# Patient Record
Sex: Male | Born: 1985 | Race: White | Hispanic: No | Marital: Married | Smoking: Former smoker
Health system: Southern US, Community
[De-identification: ages and names within clinical notes are randomized; demographics above are authoritative.]

## PROBLEM LIST (undated history)

## (undated) DIAGNOSIS — Z87820 Personal history of traumatic brain injury: Secondary | ICD-10-CM

## (undated) DIAGNOSIS — S7292XA Unspecified fracture of left femur, initial encounter for closed fracture: Secondary | ICD-10-CM

## (undated) DIAGNOSIS — S0291XA Unspecified fracture of skull, initial encounter for closed fracture: Secondary | ICD-10-CM

## (undated) HISTORY — DX: Personal history of traumatic brain injury: Z87.820

## (undated) HISTORY — DX: Unspecified fracture of skull, initial encounter for closed fracture: S02.91XA

## (undated) HISTORY — PX: CHOLECYSTECTOMY: SHX55

## (undated) HISTORY — DX: Unspecified fracture of left femur, initial encounter for closed fracture: S72.92XA

---
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
## 2006-12-25 ENCOUNTER — Ambulatory Visit: Payer: Self-pay | Admitting: Emergency Medicine

## 2010-05-19 ENCOUNTER — Emergency Department: Payer: Self-pay | Admitting: Emergency Medicine

## 2011-04-20 ENCOUNTER — Ambulatory Visit: Payer: Self-pay | Admitting: Surgery

## 2011-04-20 LAB — CBC WITH DIFFERENTIAL/PLATELET
Basophil #: 0 10*3/uL (ref 0.0–0.1)
Eosinophil #: 0.1 10*3/uL (ref 0.0–0.7)
HGB: 15.5 g/dL (ref 13.0–18.0)
Lymphocyte #: 1.4 10*3/uL (ref 1.0–3.6)
MCH: 30.5 pg (ref 26.0–34.0)
MCHC: 33.9 g/dL (ref 32.0–36.0)
MCV: 90 fL (ref 80–100)
Monocyte #: 0.5 10*3/uL (ref 0.0–0.7)
Neutrophil %: 65.4 %
Platelet: 139 10*3/uL — ABNORMAL LOW (ref 150–440)
RBC: 5.1 10*6/uL (ref 4.40–5.90)
RDW: 12.1 % (ref 11.5–14.5)

## 2011-04-20 LAB — HEPATIC FUNCTION PANEL A (ARMC)
Alkaline Phosphatase: 53 U/L (ref 50–136)
Bilirubin, Direct: 0.1 mg/dL (ref 0.00–0.20)
Bilirubin,Total: 0.7 mg/dL (ref 0.2–1.0)
SGPT (ALT): 22 U/L

## 2011-04-22 ENCOUNTER — Ambulatory Visit: Payer: Self-pay | Admitting: Surgery

## 2011-04-25 LAB — PATHOLOGY REPORT

## 2011-08-14 ENCOUNTER — Ambulatory Visit: Payer: Self-pay

## 2011-08-14 LAB — CBC WITH DIFFERENTIAL/PLATELET
Basophil #: 0.1 10*3/uL (ref 0.0–0.1)
Eosinophil #: 0.1 10*3/uL (ref 0.0–0.7)
Eosinophil %: 0.5 %
HCT: 45.8 % (ref 40.0–52.0)
HGB: 15.8 g/dL (ref 13.0–18.0)
MCH: 30.4 pg (ref 26.0–34.0)
MCHC: 34.5 g/dL (ref 32.0–36.0)
MCV: 88 fL (ref 80–100)
Monocyte #: 1.3 x10 3/mm — ABNORMAL HIGH (ref 0.2–1.0)
Monocyte %: 10.9 %
Platelet: 153 10*3/uL (ref 150–440)
RBC: 5.21 10*6/uL (ref 4.40–5.90)
RDW: 12.3 % (ref 11.5–14.5)
WBC: 11.9 10*3/uL — ABNORMAL HIGH (ref 3.8–10.6)

## 2011-08-14 LAB — RAPID INFLUENZA A&B ANTIGENS

## 2012-07-02 IMAGING — CT CT ABD-PELV W/ CM
1 of 2 series · 15 of 32 positions shown, 19 images · IV contrast (isovue)
Comparison: none

REASON FOR EXAM: (1) RLQ pain, vomiting; (2) RLQ pain, vomiting
COMMENTS:

PROCEDURE:     CT  - CT ABDOMEN / PELVIS  W  - May 19, 2010  [DATE]
RESULT:     Comparison:  None
TECHNIQUE: Multiple axial images of the abdomen and pelvis were performed
from the lung bases to the pubic symphysis, without p.o. contrast and with
100 mL of Isovue 370 intravenous contrast.

[Series 2: 3mm soft tissue · axial · 0.68mm/px · z∈[-40,+388]mm · 15 of 157 slices shown, 19 images]
[im 7/157  soft-tissue]
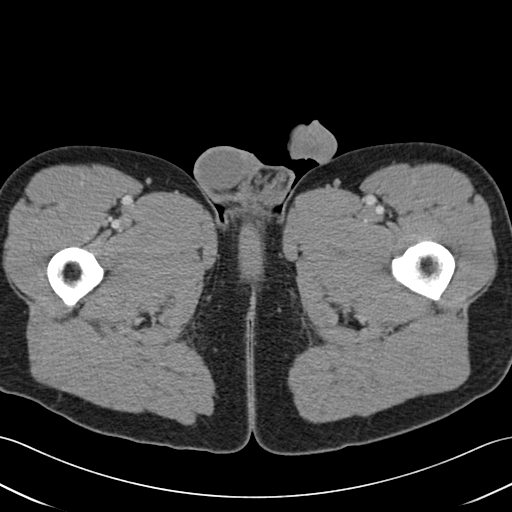
[im 7/157  bone]
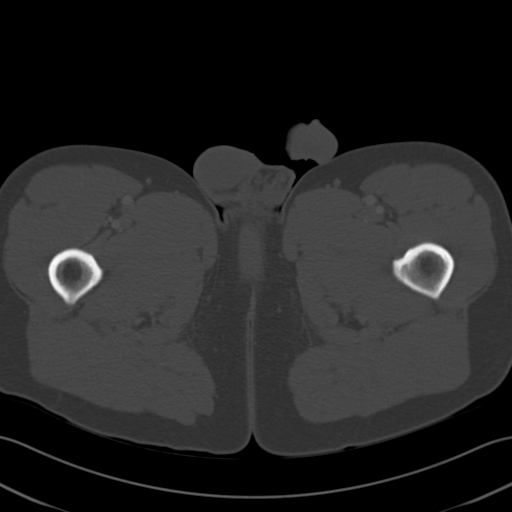
[im 20/157  soft-tissue]
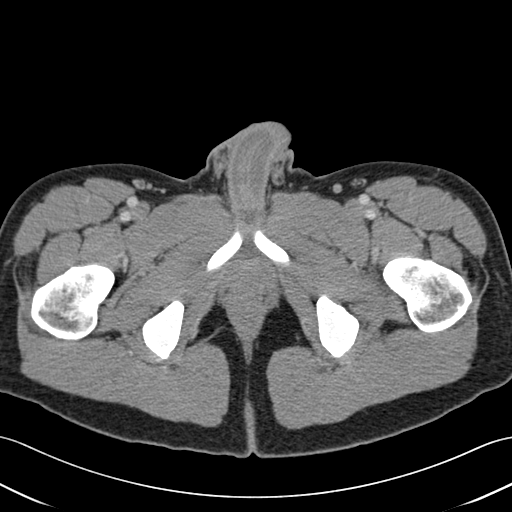
[im 33/157  soft-tissue]
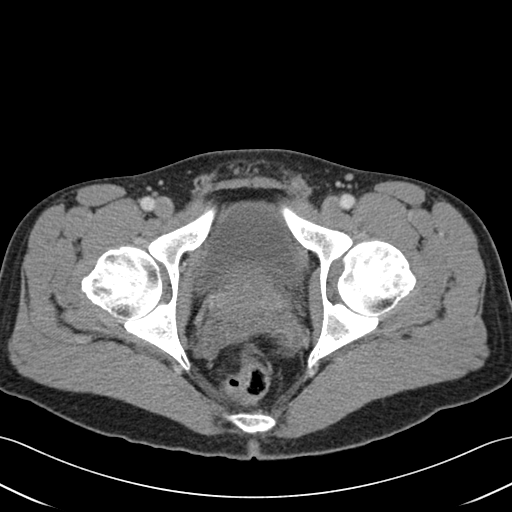
[im 46/157  soft-tissue]
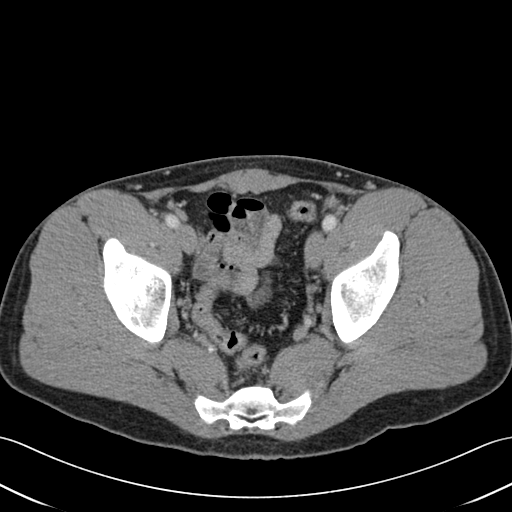
[im 53/157  soft-tissue]
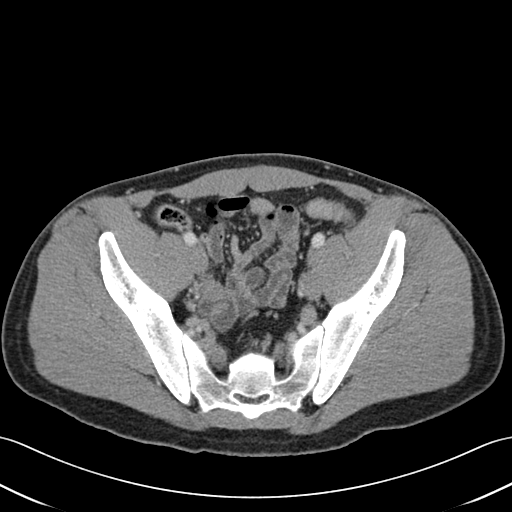
[im 66/157  soft-tissue]
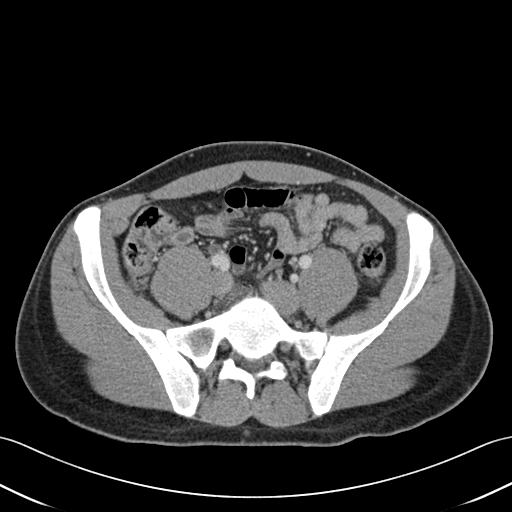
[im 79/157  soft-tissue]
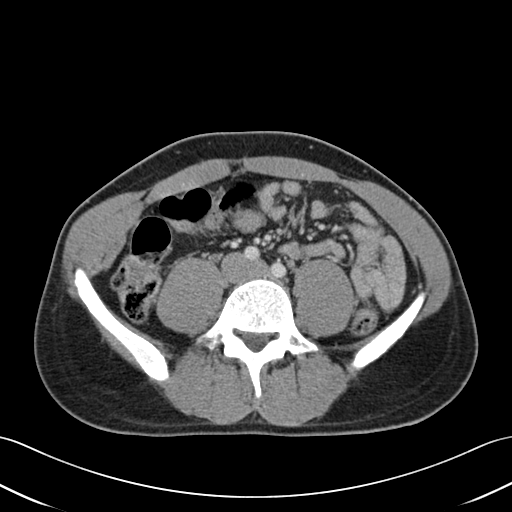
[im 92/157  soft-tissue]
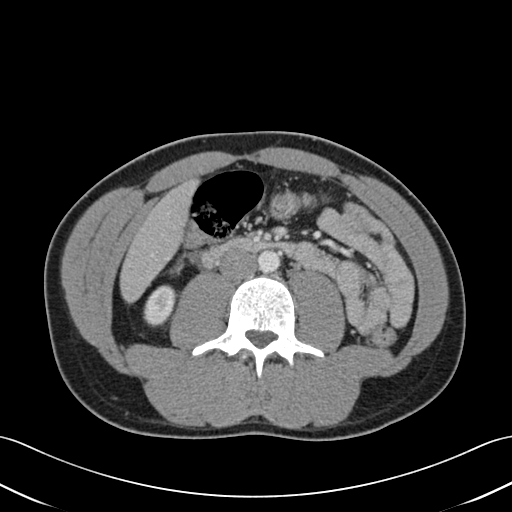
[im 105/157  soft-tissue]
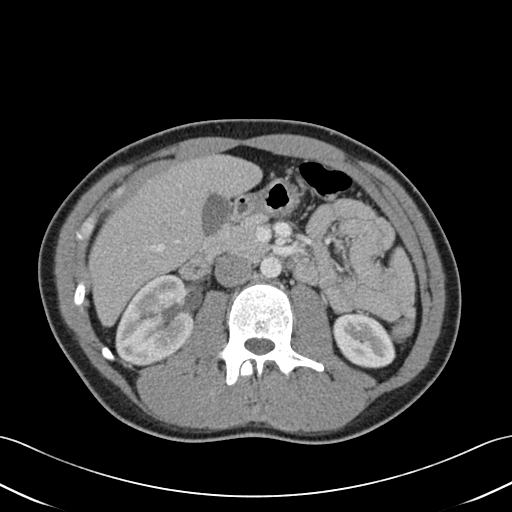
[im 105/157  bone]
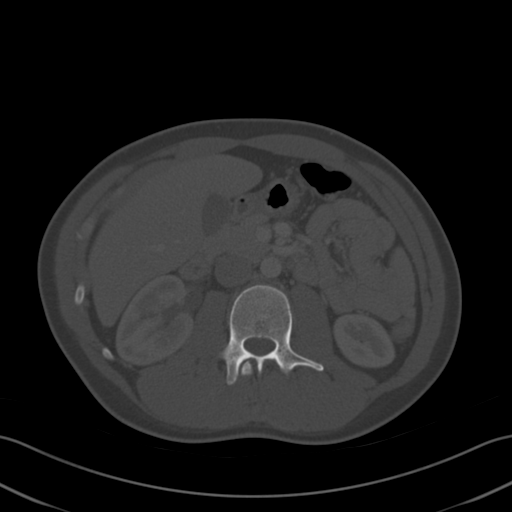
[im 111/157  soft-tissue]
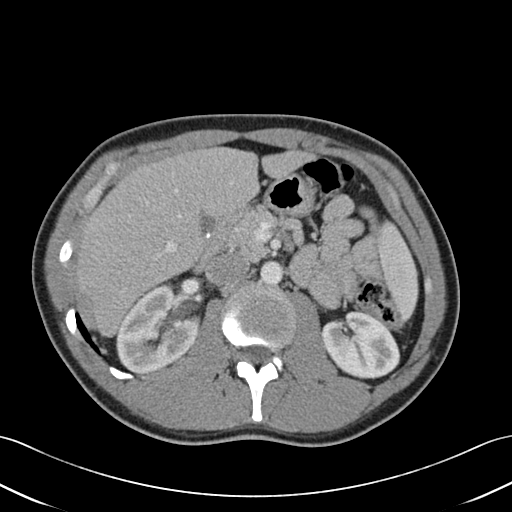
[im 124/157  soft-tissue]
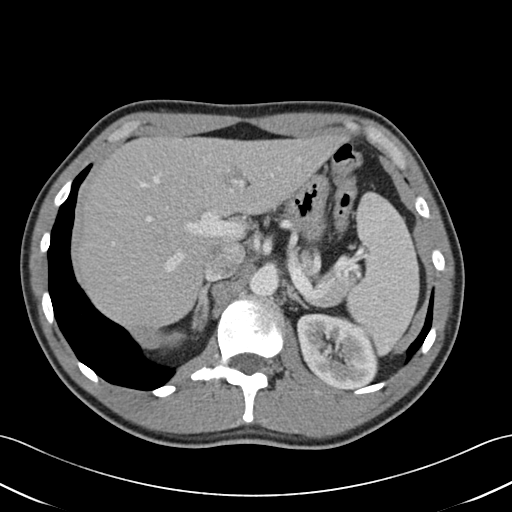
[im 131/157  lung]
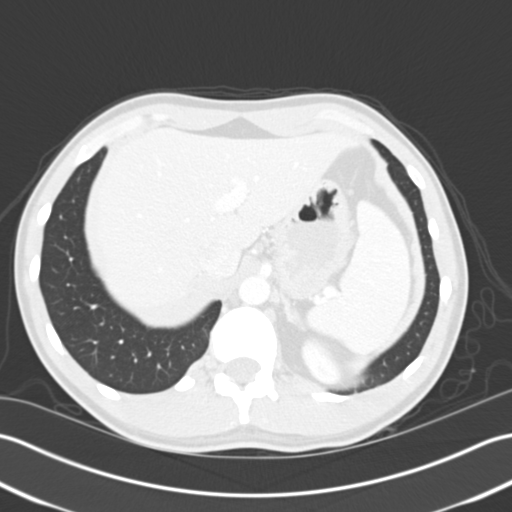
[im 137/157  soft-tissue]
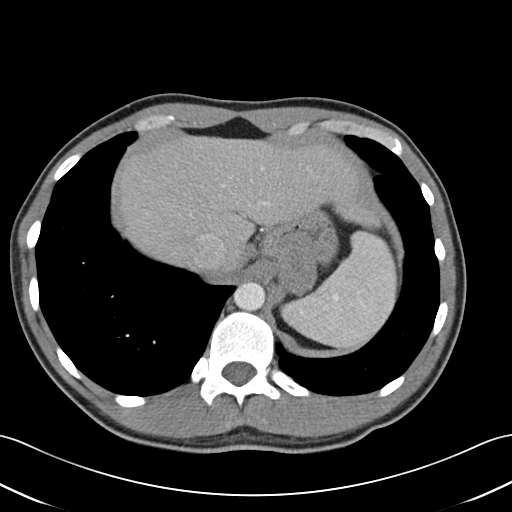
[im 137/157  lung]
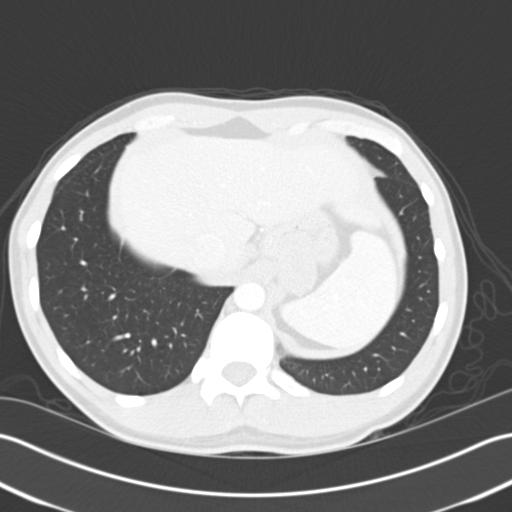
[im 144/157  lung]
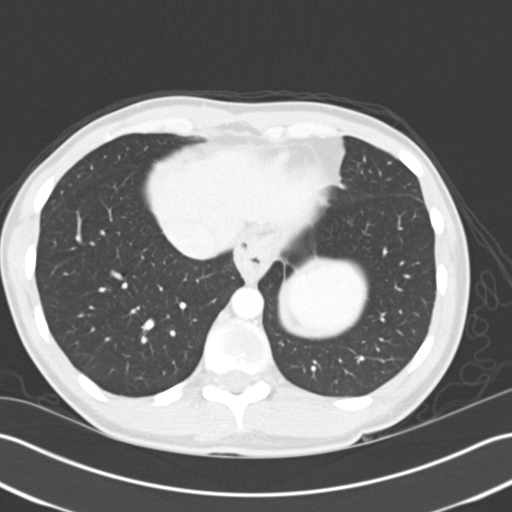
[im 150/157  soft-tissue]
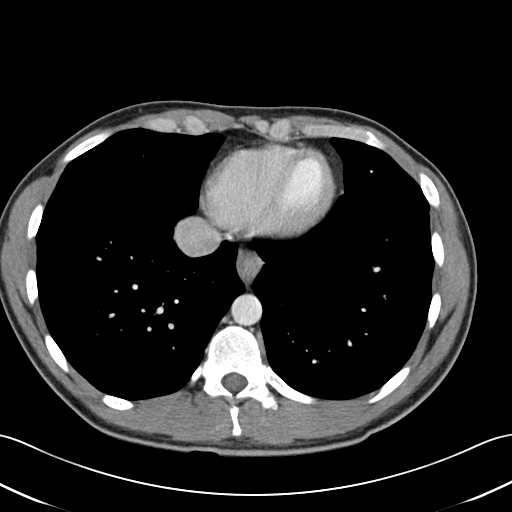
[im 150/157  lung]
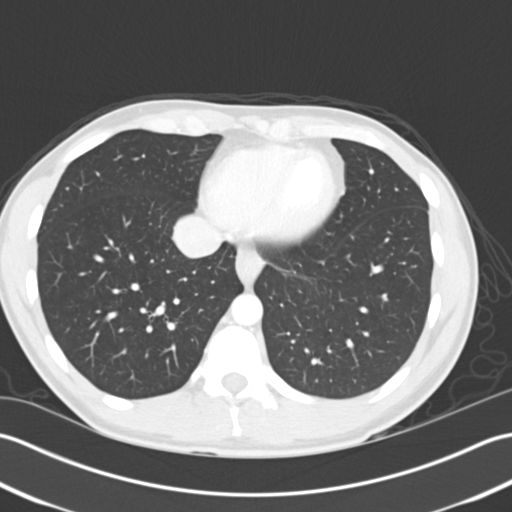

[15 of 32 positions shown; findings below may reference images not displayed]

FINDINGS: There is a tiny, subcentimeter low-attenuation lesions in the right hepatic
lobe which is too small to characterize. Several small calcified stones are
demonstrated in the gallbladder. The spleen, adrenals, and pancreas are
unremarkable. There is mild prominence of the right renal collecting system.
Minimal right perinephric stranding. The right ureter is mildly dilated. A 3
mm calculus is demonstrated at the right ureterovesical junction. Mild soft
tissue prominence at the right ureterovesical junction likely secondary to
edema.

No mesenteric or retroperitoneal lymphadenopathy. The small and large bowel
are normal in caliber. The appendix is normal.

No aggressive lytic or sclerotic osseous lesions identified. There is an old
fracture versus congenital nonunion of the right transverse process of L1.
IMPRESSION: 3 mm calculus at the right ureterovesical junction causing mild proximal
obstruction.

## 2013-06-05 IMAGING — CR DG CHOLANGIOGRAM OPERATIVE
1 series · 1 of 1 positions shown · non-contrast
Comparison: none

REASON FOR EXAM: cholelithiasis
COMMENTS:

PROCEDURE:     DXR - DXR CHOLANGIOGRAM OP (INITIAL)  - April 22, 2011  [DATE]
RESULT:     Contrasted is visualized in the hepatic ducts and common duct.
No retained stone is seen. Contrast is noted to flow into the duodenum
without evidence of obstruction.

[1]
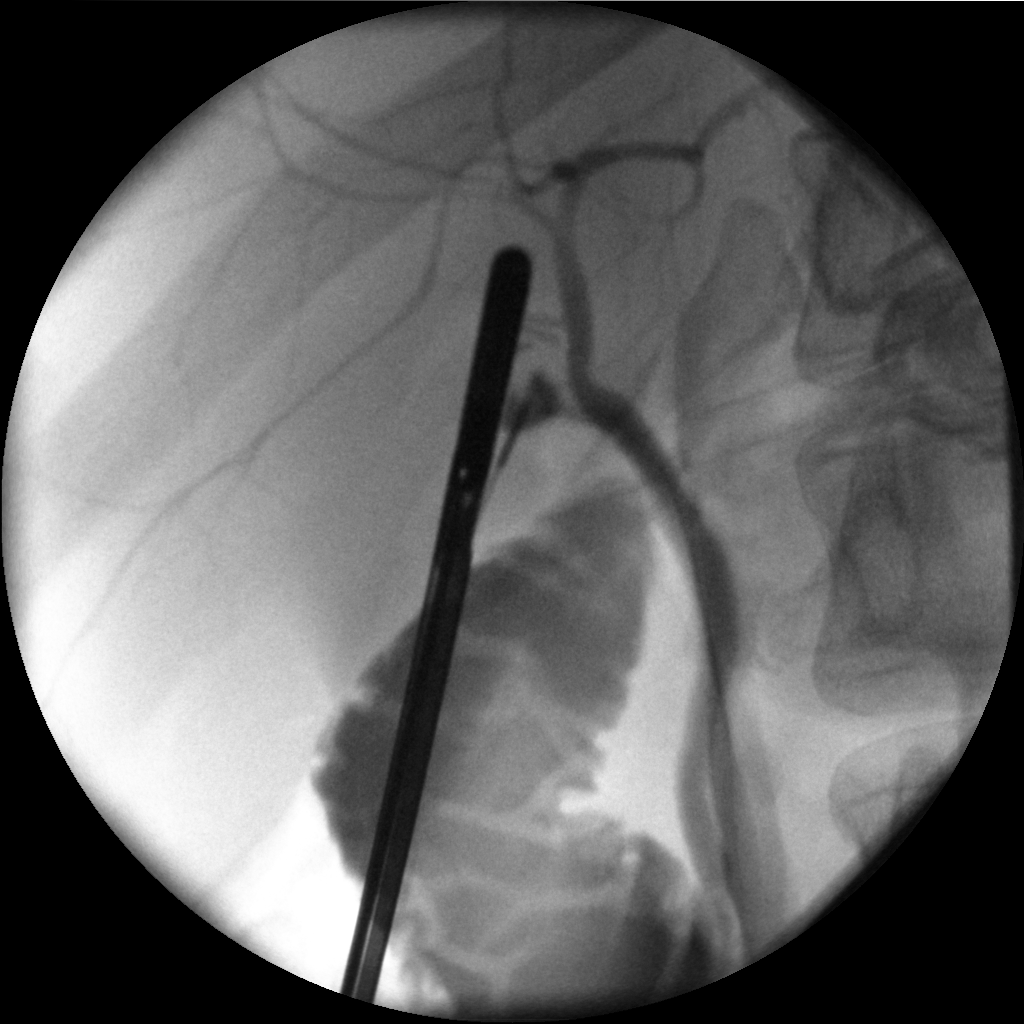

[1 of 1 positions shown; findings below may reference images not displayed]

IMPRESSION: 1.     Normal operative cholangiogram.

## 2014-08-03 NOTE — Op Note (Signed)
PATIENT NAME:  Evan Spencer, Evan Spencer MR#:  [id number] DATE OF BIRTH:  07/25/1985  DATE OF PROCEDURE:  04/22/2011  PREOPERATIVE DIAGNOSIS: Symptomatic cholelithiasis.   POSTOPERATIVE DIAGNOSIS: Symptomatic cholelithiasis.   PROCEDURE PERFORMED: Laparoscopic cholecystectomy with intraoperative cholangiography.   SURGEON: Evan Wolke A. Egbert GaribaldiBird, MD   ASSISTANTS: Surgical scrub technologist x2.   TYPE OF ANESTHESIA: General endotracheal.   SPECIMENS: Gallbladder with contents, right lateral skin lesion.   ESTIMATED BLOOD LOSS: Minimal.   DRAINS: None.   LAP AND NEEDLE COUNT: Correct x2.   DESCRIPTION OF PROCEDURE: With the patient in the supine position, general endotracheal anesthesia was induced. The left arm was padded and tucked at his side. His abdomen had been previously clipped of hair, was prepped and draped utilizing ChloraPrep solution. Time-out procedure was observed.   A 12 mm blunt Hassan trocar was placed through an open technique through an infraumbilical transversely oriented skin incision with stay sutures being passed through the fascia on either side. Pneumoperitoneum was established. A 12 mm blunt Hassan trocar was placed. Laparoscopic evaluation of the abdomen demonstrated no evidence of inguinal hernias. The patient was then positioned in reverse Trendelenburg and airplane right side up. A 5 mm Bladeless trocar was placed in the epigastrium. Two 5-mm trocars were then placed in the right subcostal margin. The gallbladder was grasped along its fundus and elevated towards the right shoulder. Lateral traction was placed on the infundibulum. Dissection was carried out in the region of the cystic duct and cystic artery and critical view of safety cholecystectomy was performed. The cystic artery was doubly ligated on the portal side, singly clipped on the gallbladder side and divided. Kumar cholangiogram catheter was then applied to the infundibulum. Cholangiography was performed. No filling  defects were noted. Intrahepatic biliary radicles were normal.   The cystic duct was then triply ligated on the portal side, singly clipped on the gallbladder side and divided. Gallbladder was then retrieved off the gallbladder fossa utilizing hook cautery apparatus and captured in an EndoCatch device. It was retrieved through the infraumbilical port site without difficulty.   Pneumoperitoneum was re-established. The right upper quadrant was irrigated with 500 mL warm normal saline. Hemostasis appeared to be adequate. Ports were then removed under direct visualization.   There was a small 3 mm pedunculated mole just lateral to the lowermost right upper quadrant trocar site. This was excised and submitted to pathology with incorporation of the excision into the existing wound. A total of 30 mL of 0.25% plain Marcaine was then infiltrated in the skin and fascia. Skin edges were reapproximated utilizing 4-0 Vicryl. An additional figure-of-eight #0 Vicryl suture in vertical orientation was placed. The existing stay sutures were tied to each other. Benzoin, Steri-Strips, Telfa, and Tegaderm were then applied.   The patient was then subsequently extubated and taken to the recovery room in stable and satisfactory condition by anesthesia services.   ____________________________ Redge GainerMark A. Egbert GaribaldiBird, MD mab:drc D: 04/22/2011 08:56:48 ET T: 04/22/2011 10:20:26 ET JOB#: [id number]  cc: Loraine LericheMark A. Egbert GaribaldiBird, MD, <Dictator> Steele SizerMark A. Crissman, MD Yehia Mcbain Kela MillinA Tallen Schnorr MD ELECTRONICALLY SIGNED 04/22/2011 16:03

## 2015-07-20 ENCOUNTER — Ambulatory Visit (INDEPENDENT_AMBULATORY_CARE_PROVIDER_SITE_OTHER): Payer: BLUE CROSS/BLUE SHIELD | Admitting: Family Medicine

## 2015-07-20 ENCOUNTER — Encounter: Payer: Self-pay | Admitting: Family Medicine

## 2015-07-20 VITALS — BP 124/80 | HR 86 | Temp 98.2°F | Ht 72.0 in | Wt 168.0 lb

## 2015-07-20 DIAGNOSIS — M545 Low back pain: Secondary | ICD-10-CM | POA: Diagnosis not present

## 2015-07-20 NOTE — Progress Notes (Signed)
BP 124/80 mmHg  Pulse 86  Temp(Src) 98.2 F (36.8 C)  Ht 6' (1.829 m)  Wt 168 lb (76.204 kg)  BMI 22.78 kg/m2  SpO2 97%   Subjective:    Patient ID: Evan Spencer, male    DOB: Sep 29, 1985, 30 y.o.   MRN: [id number]  HPI: Evan Spencer is a 30 y.o. male  Chief Complaint  Patient presents with  . paperwork  Patient all in all doing well wants to have a standup sit down desk H paperwork signed otherwise doing well no medical problems Has some back pain and stiffness without standing up from time to time.  Relevant past medical, surgical, family and social history reviewed and updated as indicated. Interim medical history since our last visit reviewed. Allergies and medications reviewed and updated.  Review of Systems  Per HPI unless specifically indicated above     Objective:    BP 124/80 mmHg  Pulse 86  Temp(Src) 98.2 F (36.8 C)  Ht 6' (1.829 m)  Wt 168 lb (76.204 kg)  BMI 22.78 kg/m2  SpO2 97%  Wt Readings from Last 3 Encounters:  07/20/15 168 lb (76.204 kg)    Physical Exam  Constitutional: He is oriented to person, place, and time. He appears well-developed and well-nourished. No distress.  HENT:  Head: Normocephalic and atraumatic.  Right Ear: Hearing normal.  Left Ear: Hearing normal.  Nose: Nose normal.  Eyes: Conjunctivae and lids are normal. Right eye exhibits no discharge. Left eye exhibits no discharge. No scleral icterus.  Cardiovascular: Normal rate, regular rhythm and normal heart sounds.   Pulmonary/Chest: Effort normal and breath sounds normal. No respiratory distress.  Musculoskeletal: Normal range of motion.  Neurological: He is alert and oriented to person, place, and time.  Skin: Skin is intact. No rash noted.  Psychiatric: He has a normal mood and affect. His speech is normal and behavior is normal. Judgment and thought content normal. Cognition and memory are normal.    Results for orders placed or performed in visit on  08/14/11  Influenza A&B Antigens [facility])  Result Value Ref Range   Micro Text Report         SOURCE: NASO    COMMENT                   NEGATIVE FOR INFLUENZA A AND B   ANTIBIOTIC                                                      Rapid Strep-A with Reflx  Result Value Ref Range   Micro Text Report NEGATIVE   Beta Strep Culture [facility])  Result Value Ref Range   Micro Text Report         SOURCE: THROAT    ORGANISM 1                MODERATE GROWTH GROUP C STREPTOCOCCUS   COMMENT                   -   ANTIBIOTIC  CBC with Differential/Platelet  Result Value Ref Range   WBC 11.9 (H) 3.8-10.6 x10 3/mm 3   RBC 5.21 4.40-5.90 x10 6/mm 3   HGB 15.8 13.0-18.0 g/dL   HCT 14.7 82.9-56.2 %   MCV 88 80-100 fL   MCH 30.4 26.0-34.0 pg   MCHC 34.5 32.0-36.0 g/dL   RDW 13.0 86.5-78.4 %   Platelet 153 150-440 x10 3/mm 3   Neutrophil % 76.6 %   Lymphocyte % 11.1 %   Monocyte % 10.9 %   Eosinophil % 0.5 %   Basophil % 0.9 %   Neutrophil # 9.1 (H) 1.4-6.5 x10 3/mm 3   Lymphocyte # 1.3 1.0-3.6 x10 3/mm 3   Monocyte # 1.3 (H) 0.2-1.0 x10 3/mm    Eosinophil # 0.1 0.0-0.7 x10 3/mm 3   Basophil # 0.1 0.0-0.1 x10 3/mm 3  Mononucleosis screen  Result Value Ref Range   Mono Test NEGATIVE NEGATIVE      Assessment & Plan:   Problem List Items Addressed This Visit    None    Visit Diagnoses    Low back pain without sciatica, unspecified back pain laterality    -  Primary    Discuss recommendations for standup sitdown desk and activity        Follow up plan: Return for Physical Exam.

## 2015-09-02 ENCOUNTER — Encounter: Payer: BLUE CROSS/BLUE SHIELD | Admitting: Family Medicine

## 2015-09-11 ENCOUNTER — Ambulatory Visit (INDEPENDENT_AMBULATORY_CARE_PROVIDER_SITE_OTHER): Payer: BLUE CROSS/BLUE SHIELD | Admitting: Family Medicine

## 2015-09-11 ENCOUNTER — Encounter: Payer: Self-pay | Admitting: Family Medicine

## 2015-09-11 VITALS — BP 119/85 | HR 65 | Temp 97.6°F | Ht 71.8 in | Wt 162.0 lb

## 2015-09-11 DIAGNOSIS — J309 Allergic rhinitis, unspecified: Secondary | ICD-10-CM | POA: Diagnosis not present

## 2015-09-11 DIAGNOSIS — Z0000 Encounter for general adult medical examination without abnormal findings: Secondary | ICD-10-CM

## 2015-09-11 LAB — URINALYSIS, ROUTINE W REFLEX MICROSCOPIC
Bilirubin, UA: NEGATIVE
Glucose, UA: NEGATIVE
KETONES UA: NEGATIVE
LEUKOCYTES UA: NEGATIVE
NITRITE UA: NEGATIVE
Protein, UA: NEGATIVE
RBC UA: NEGATIVE
SPEC GRAV UA: 1.01 (ref 1.005–1.030)
Urobilinogen, Ur: 0.2 mg/dL (ref 0.2–1.0)
pH, UA: 6.5 (ref 5.0–7.5)

## 2015-09-11 MED ORDER — TRIAMCINOLONE ACETONIDE 55 MCG/ACT NA AERO: 2.0000 | INHALATION_SPRAY | Freq: Every day | NASAL | Status: DC

## 2015-09-11 MED ORDER — OMEPRAZOLE 20 MG PO CPDR: 20.0000 mg | DELAYED_RELEASE_CAPSULE | Freq: Every day | ORAL | Status: DC

## 2015-09-11 MED ORDER — SILDENAFIL CITRATE 100 MG PO TABS: 50.0000 mg | ORAL_TABLET | Freq: Every day | ORAL | Status: DC | PRN

## 2015-09-11 NOTE — Progress Notes (Signed)
BP 119/85 mmHg  Pulse 65  Temp(Src) 97.6 F (36.4 C)  Ht 5' 11.8" (1.824 m)  Wt 162 lb (73.483 kg)  BMI 22.09 kg/m2  SpO2 99%   Subjective:    Patient ID: Evan Spencer, male    DOB: [birth year], 30 y.o.   MRN: [id number]  HPI: Evan Spencer is a 29 y.o. male  Chief Complaint  Patient presents with  . Annual Exam  Patient with some mild runny nose nasal drip throughout the day throughout the seasons. Tried occasional Zyrtec without any relief. Not any worse in the morning or other times of day. No known exposures or allergies no associated cough or wheeze.   Relevant past medical, surgical, family and social history reviewed and updated as indicated. Interim medical history since our last visit reviewed. Allergies and medications reviewed and updated.  Review of Systems  Constitutional: Negative.   HENT: Negative.   Eyes: Negative.   Respiratory: Negative.   Cardiovascular: Negative.   Gastrointestinal: Negative.   Endocrine: Negative.   Genitourinary: Negative.   Musculoskeletal: Negative.   Skin: Negative.   Allergic/Immunologic: Negative.   Neurological: Negative.   Hematological: Negative.   Psychiatric/Behavioral: Negative.     Per HPI unless specifically indicated above     Objective:    BP 119/85 mmHg  Pulse 65  Temp(Src) 97.6 F (36.4 C)  Ht 5' 11.8" (1.824 m)  Wt 162 lb (73.483 kg)  BMI 22.09 kg/m2  SpO2 99%  Wt Readings from Last 3 Encounters:  09/11/15 162 lb (73.483 kg)  07/20/15 168 lb (76.204 kg)    Physical Exam  Constitutional: He is oriented to person, place, and time. He appears well-developed and well-nourished.  HENT:  Head: Normocephalic.  Right Ear: External ear normal.  Left Ear: External ear normal.  Nose: Nose normal.  Eyes: Conjunctivae and EOM are normal. Pupils are equal, round, and reactive to light.  Neck: Normal range of motion. Neck supple. No thyromegaly present.  Cardiovascular: Normal rate, regular  rhythm, normal heart sounds and intact distal pulses.   Pulmonary/Chest: Effort normal and breath sounds normal.  Abdominal: Soft. Bowel sounds are normal. There is no splenomegaly or hepatomegaly.  Genitourinary: Penis normal.  Musculoskeletal: Normal range of motion.  Lymphadenopathy:    He has no cervical adenopathy.  Neurological: He is alert and oriented to person, place, and time. He has normal reflexes.  Skin: Skin is warm and dry.  Psychiatric: He has a normal mood and affect. His behavior is normal. Judgment and thought content normal.    Results for orders placed or performed in visit on 08/14/11  Influenza A&B Antigens [facility])  Result Value Ref Range   Micro Text Report         SOURCE: NASO    COMMENT                   NEGATIVE FOR INFLUENZA A AND B   ANTIBIOTIC                                                      Rapid Strep-A with Reflx  Result Value Ref Range   Micro Text Report NEGATIVE   Beta Strep Culture [facility])  Result Value Ref Range   Micro Text Report  SOURCE: THROAT    ORGANISM 1                MODERATE GROWTH GROUP C STREPTOCOCCUS   COMMENT                   -   ANTIBIOTIC                                                      CBC with Differential/Platelet  Result Value Ref Range   WBC 11.9 (H) 3.8-10.6 x10 3/mm 3   RBC 5.21 4.40-5.90 x10 6/mm 3   HGB 15.8 13.0-18.0 g/dL   HCT 45.445.8 09.8-11.940.0-52.0 %   MCV 88 80-100 fL   MCH 30.4 26.0-34.0 pg   MCHC 34.5 32.0-36.0 g/dL   RDW 14.712.3 82.9-56.211.5-14.5 %   Platelet 153 150-440 x10 3/mm 3   Neutrophil % 76.6 %   Lymphocyte % 11.1 %   Monocyte % 10.9 %   Eosinophil % 0.5 %   Basophil % 0.9 %   Neutrophil # 9.1 (H) 1.4-6.5 x10 3/mm 3   Lymphocyte # 1.3 1.0-3.6 x10 3/mm 3   Monocyte # 1.3 (H) 0.2-1.0 x10 3/mm    Eosinophil # 0.1 0.0-0.7 x10 3/mm 3   Basophil # 0.1 0.0-0.1 x10 3/mm 3  Mononucleosis screen  Result Value Ref Range   Mono Test NEGATIVE NEGATIVE      Assessment & Plan:   Problem List  Items Addressed This Visit      Respiratory   Allergic rhinitis    Discuss allergy care and treatment use of medications and avoidance       Other Visit Diagnoses    Routine general medical examination at a health care facility    -  Primary    Relevant Orders    CBC with Differential/Platelet    Comprehensive metabolic panel    Lipid Panel w/o Chol/HDL Ratio    TSH    Urinalysis, Routine w reflex microscopic (not at [facility])    HIV antibody    Healthcare maintenance        Relevant Orders    HIV antibody        Follow up plan: Return in about 1 year (around 09/10/2016) for Physical Exam.

## 2015-09-11 NOTE — Addendum Note (Signed)
Addended by: Lurlean HornsWILSON, Malikah Principato H on: 09/11/2015 10:32 AM   Modules accepted: Kipp BroodSmartSet

## 2015-09-11 NOTE — Assessment & Plan Note (Signed)
Discuss allergy care and treatment use of medications and avoidance

## 2015-09-14 ENCOUNTER — Encounter: Payer: Self-pay | Admitting: Family Medicine

## 2015-10-21 LAB — CBC WITH DIFFERENTIAL/PLATELET
BASOS ABS: 0.1 10*3/uL (ref 0.0–0.2)
Basos: 1 %
EOS (ABSOLUTE): 0.2 10*3/uL (ref 0.0–0.4)
Eos: 2 %
Hematocrit: 46 % (ref 37.5–51.0)
Hemoglobin: 16.3 g/dL (ref 12.6–17.7)
IMMATURE GRANS (ABS): 0 10*3/uL (ref 0.0–0.1)
IMMATURE GRANULOCYTES: 0 %
LYMPHS: 29 %
Lymphocytes Absolute: 2.3 10*3/uL (ref 0.7–3.1)
MCH: 30.4 pg (ref 26.6–33.0)
MCHC: 35.4 g/dL (ref 31.5–35.7)
MCV: 86 fL (ref 79–97)
Monocytes Absolute: 0.6 10*3/uL (ref 0.1–0.9)
Monocytes: 7 %
NEUTROS ABS: 4.6 10*3/uL (ref 1.4–7.0)
NEUTROS PCT: 61 %
PLATELETS: 198 10*3/uL (ref 150–379)
RBC: 5.36 x10E6/uL (ref 4.14–5.80)
RDW: 12.3 % (ref 12.3–15.4)
WBC: 7.7 10*3/uL (ref 3.4–10.8)

## 2015-10-21 LAB — COMPREHENSIVE METABOLIC PANEL
ALT: 18 IU/L (ref 0–44)
AST: 18 IU/L (ref 0–40)
Albumin/Globulin Ratio: 2 (ref 1.2–2.2)
Albumin: 5.1 g/dL (ref 3.5–5.5)
Alkaline Phosphatase: 67 IU/L (ref 39–117)
BILIRUBIN TOTAL: 1.2 mg/dL (ref 0.0–1.2)
BUN/Creatinine Ratio: 16 (ref 9–20)
BUN: 18 mg/dL (ref 6–20)
CHLORIDE: 98 mmol/L (ref 96–106)
CO2: 23 mmol/L (ref 18–29)
Calcium: 9.9 mg/dL (ref 8.7–10.2)
Creatinine, Ser: 1.12 mg/dL (ref 0.76–1.27)
GFR calc non Af Amer: 86 mL/min/{1.73_m2} (ref >59)
GFR, EST AFRICAN AMERICAN: 100 mL/min/{1.73_m2} (ref >59)
GLUCOSE: 95 mg/dL (ref 65–99)
Globulin, Total: 2.6 g/dL (ref 1.5–4.5)
POTASSIUM: 4.6 mmol/L (ref 3.5–5.2)
SODIUM: 137 mmol/L (ref 134–144)
TOTAL PROTEIN: 7.7 g/dL (ref 6.0–8.5)

## 2015-10-21 LAB — LIPID PANEL W/O CHOL/HDL RATIO
Cholesterol, Total: 183 mg/dL (ref 100–199)
HDL: 52 mg/dL (ref >39)
LDL Calculated: 113 mg/dL — ABNORMAL HIGH (ref 0–99)
TRIGLYCERIDES: 92 mg/dL (ref 0–149)
VLDL Cholesterol Cal: 18 mg/dL (ref 5–40)

## 2015-10-21 LAB — TSH: TSH: 1.8 u[IU]/mL (ref 0.450–4.500)

## 2015-10-21 LAB — HIV ANTIBODY (ROUTINE TESTING W REFLEX): HIV Screen 4th Generation wRfx: NONREACTIVE

## 2015-11-19 ENCOUNTER — Telehealth: Payer: Self-pay

## 2015-11-19 MED ORDER — OMEPRAZOLE 20 MG PO CPDR: 20.0000 mg | DELAYED_RELEASE_CAPSULE | Freq: Every day | ORAL | 12 refills | Status: DC

## 2015-11-19 NOTE — Telephone Encounter (Signed)
Pharmacy ([facility]) is requesting a 90 day Rx for  Omeprazole Caps 

## 2016-05-16 ENCOUNTER — Encounter: Payer: Self-pay | Admitting: Family Medicine

## 2016-05-16 ENCOUNTER — Ambulatory Visit (INDEPENDENT_AMBULATORY_CARE_PROVIDER_SITE_OTHER): Payer: BLUE CROSS/BLUE SHIELD | Admitting: Family Medicine

## 2016-05-16 VITALS — BP 138/92 | HR 79 | Temp 98.3°F | Wt 152.0 lb

## 2016-05-16 DIAGNOSIS — J069 Acute upper respiratory infection, unspecified: Secondary | ICD-10-CM

## 2016-05-16 MED ORDER — ALBUTEROL SULFATE HFA 108 (90 BASE) MCG/ACT IN AERS: 2.0000 | INHALATION_SPRAY | Freq: Four times a day (QID) | RESPIRATORY_TRACT | 0 refills | Status: DC | PRN

## 2016-05-16 MED ORDER — HYDROCOD POLST-CPM POLST ER 10-8 MG/5ML PO SUER: 5.0000 mL | Freq: Two times a day (BID) | ORAL | 0 refills | Status: DC | PRN

## 2016-05-16 MED ORDER — AMOXICILLIN-POT CLAVULANATE 875-125 MG PO TABS: 1.0000 | ORAL_TABLET | Freq: Two times a day (BID) | ORAL | 0 refills | Status: DC

## 2016-05-16 MED ORDER — BENZONATATE 100 MG PO CAPS: 200.0000 mg | ORAL_CAPSULE | Freq: Three times a day (TID) | ORAL | 0 refills | Status: DC | PRN

## 2016-05-16 NOTE — Patient Instructions (Signed)
Follow up as needed

## 2016-05-16 NOTE — Progress Notes (Signed)
   BP (!) 138/92   Pulse 79   Temp 98.3 F (36.8 C)   Wt 152 lb (68.9 kg)   SpO2 97%   BMI 20.73 kg/m    Subjective:    Patient ID: Evan Spencer, male    DOB: [birth year], 31 y.o.   MRN: [id number]  HPI: Evan AversJoseph L Hinostroza Spencer is a 31 y.o. male  Chief Complaint  Patient presents with  . URI    x 1 week, dry cough, chest congestion, productive cough, sneezing, runny nose, scratchy throat. No fever, no body aches, no ear ache.   . Fever Blisters    he occasionally gets out breaks, wants to know if he can get an rx.   Productive cough x 1 week. Mild congestion and sore throat, but mainly the cough. Now having chest tightness and wheezing. Denies fever, chills, aches, CP. Taking some drinkable remedies and halls cough drops. No sick contacts.   Relevant past medical, surgical, family and social history reviewed and updated as indicated. Interim medical history since our last visit reviewed. Allergies and medications reviewed and updated.  Review of Systems  Constitutional: Negative.   HENT: Positive for congestion and sore throat.   Respiratory: Positive for cough, chest tightness and wheezing.   Cardiovascular: Negative.   Gastrointestinal: Negative.   Genitourinary: Negative.   Musculoskeletal: Negative.   Neurological: Negative.   Psychiatric/Behavioral: Negative.     Per HPI unless specifically indicated above     Objective:    BP (!) 138/92   Pulse 79   Temp 98.3 F (36.8 C)   Wt 152 lb (68.9 kg)   SpO2 97%   BMI 20.73 kg/m   Wt Readings from Last 3 Encounters:  05/16/16 152 lb (68.9 kg)  09/11/15 162 lb (73.5 kg)  07/20/15 168 lb (76.2 kg)    Physical Exam  Constitutional: He is oriented to person, place, and time. He appears well-developed and well-nourished. No distress.  HENT:  Head: Atraumatic.  Right Ear: External ear normal.  Left Ear: External ear normal.  Oropharynx erythematous  Eyes: Conjunctivae are normal. Pupils are equal, round, and  reactive to light.  Neck: Normal range of motion. Neck supple.  Cardiovascular: Normal rate and normal heart sounds.   Pulmonary/Chest: Effort normal. No respiratory distress. He has wheezes (mild wheezes).  Musculoskeletal: Normal range of motion.  Neurological: He is alert and oriented to person, place, and time.  Skin: Skin is warm and dry.  Psychiatric: He has a normal mood and affect. His behavior is normal.  Nursing note and vitals reviewed.     Assessment & Plan:   Problem List Items Addressed This Visit    None    Visit Diagnoses    Upper respiratory tract infection, unspecified type    -  Primary   Augmentin, tessalon, tussionex, and albuterol inhaler sent. Discussed precautions and directions with medications. Follow up if worsening or no improvement       Follow up plan: Return if symptoms worsen or fail to improve.

## 2016-05-23 ENCOUNTER — Telehealth: Payer: Self-pay | Admitting: Family Medicine

## 2016-05-23 MED ORDER — DOXYCYCLINE HYCLATE 100 MG PO TABS: 100.0000 mg | ORAL_TABLET | Freq: Two times a day (BID) | ORAL | 0 refills | Status: DC

## 2016-05-23 NOTE — Telephone Encounter (Signed)
Routing to provider  

## 2016-05-23 NOTE — Telephone Encounter (Signed)
Patient notified. Per Fleet Contrasachel, he can try OTC Abreva. Advised patient he can try that and if no improvement, to call and schedule an appointment.

## 2016-05-23 NOTE — Telephone Encounter (Signed)
Doxycycline sent. Pt will need appt to discuss blisters

## 2016-06-09 DIAGNOSIS — S7292XA Unspecified fracture of left femur, initial encounter for closed fracture: Secondary | ICD-10-CM

## 2016-06-09 HISTORY — DX: Unspecified fracture of left femur, initial encounter for closed fracture: S72.92XA

## 2016-09-26 ENCOUNTER — Ambulatory Visit (INDEPENDENT_AMBULATORY_CARE_PROVIDER_SITE_OTHER): Payer: BLUE CROSS/BLUE SHIELD | Admitting: Family Medicine

## 2016-09-26 ENCOUNTER — Telehealth: Payer: Self-pay

## 2016-09-26 ENCOUNTER — Encounter: Payer: Self-pay | Admitting: Family Medicine

## 2016-09-26 VITALS — BP 133/94 | HR 97 | Temp 99.6°F | Wt 153.0 lb

## 2016-09-26 DIAGNOSIS — J069 Acute upper respiratory infection, unspecified: Secondary | ICD-10-CM | POA: Diagnosis not present

## 2016-09-26 DIAGNOSIS — J309 Allergic rhinitis, unspecified: Secondary | ICD-10-CM | POA: Diagnosis not present

## 2016-09-26 MED ORDER — HYDROCOD POLST-CPM POLST ER 10-8 MG/5ML PO SUER: 5.0000 mL | Freq: Two times a day (BID) | ORAL | 0 refills | Status: DC | PRN

## 2016-09-26 MED ORDER — BENZONATATE 100 MG PO CAPS: 200.0000 mg | ORAL_CAPSULE | Freq: Three times a day (TID) | ORAL | 0 refills | Status: DC | PRN

## 2016-09-26 MED ORDER — FLUTICASONE PROPIONATE 50 MCG/ACT NA SUSP
2.0000 | Freq: Every day | NASAL | 6 refills | Status: DC
Start: 2016-09-26 — End: 2017-04-17

## 2016-09-26 MED ORDER — CETIRIZINE HCL 10 MG PO TABS: 10.0000 mg | ORAL_TABLET | Freq: Every day | ORAL | 11 refills | Status: DC

## 2016-09-26 MED ORDER — ALBUTEROL SULFATE HFA 108 (90 BASE) MCG/ACT IN AERS
2.0000 | INHALATION_SPRAY | Freq: Four times a day (QID) | RESPIRATORY_TRACT | 0 refills | Status: DC | PRN
Start: 2016-09-26 — End: 2016-09-26

## 2016-09-26 MED ORDER — ALBUTEROL SULFATE HFA 108 (90 BASE) MCG/ACT IN AERS: 1.0000 | INHALATION_SPRAY | Freq: Four times a day (QID) | RESPIRATORY_TRACT | 1 refills | Status: AC | PRN

## 2016-09-26 NOTE — Telephone Encounter (Signed)
Proair sent

## 2016-09-26 NOTE — Assessment & Plan Note (Signed)
Will start zyrtec and flonase daily given his environmental triggers and frequent URIs

## 2016-09-26 NOTE — Progress Notes (Signed)
BP (!) 133/94   Pulse 97   Temp 99.6 F (37.6 C)   Wt 153 lb (69.4 kg)   SpO2 97%   BMI 20.87 kg/m    Subjective:    Patient ID: Evan Spencer, male    DOB: September 19, 1985, 31 y.o.   MRN: [id number]  HPI: Evan AversJoseph L Portman Spencer is a 31 y.o. male  Chief Complaint  Patient presents with  . Sinusitis    x 3 days, sinus headache, head/chest congestion, runny nose, productive cough, sinus drainage, raw throat. No fever, no ear ache.    Patient presents with 3 days of congestion, sinus pressure, headache, productive cough. Sxs began after their large acreage of hay was trimmed down earlier this week with itchy eyes, sneezing, sore throat, cough and have progressed to sinus sxs. Has been at the hospital with a family member for about 10 days now, and family member has b/l pneumonia and tested positive for c diff. Denies any diarrhea, abd cramping, fevers, SOB, CP at this time. Not taking anything for sxs currently.   Relevant past medical, surgical, family and social history reviewed and updated as indicated. Interim medical history since our last visit reviewed. Allergies and medications reviewed and updated.  Review of Systems  Constitutional: Negative.   HENT: Positive for congestion, sinus pressure, sneezing and sore throat.   Eyes: Positive for itching.  Respiratory: Positive for cough.   Gastrointestinal: Negative.  Negative for diarrhea.  Genitourinary: Negative.   Musculoskeletal: Negative.   Neurological: Positive for headaches.  Psychiatric/Behavioral: Negative.     Per HPI unless specifically indicated above     Objective:    BP (!) 133/94   Pulse 97   Temp 99.6 F (37.6 C)   Wt 153 lb (69.4 kg)   SpO2 97%   BMI 20.87 kg/m   Wt Readings from Last 3 Encounters:  09/26/16 153 lb (69.4 kg)  05/16/16 152 lb (68.9 kg)  09/11/15 162 lb (73.5 kg)    Physical Exam  Constitutional: He is oriented to person, place, and time. He appears well-developed and  well-nourished. No distress.  HENT:  Head: Atraumatic.  Right Ear: External ear normal.  Left Ear: External ear normal.  Oropharynx erythematous posteriorly Nasal mucosa significantly erythematous and edematous  Eyes: Conjunctivae are normal. Pupils are equal, round, and reactive to light. No scleral icterus.  Neck: Normal range of motion. Neck supple.  Cardiovascular: Normal rate and normal heart sounds.   Pulmonary/Chest: Effort normal and breath sounds normal. No respiratory distress.  Abdominal: Soft. Bowel sounds are normal. There is no tenderness.  Musculoskeletal: Normal range of motion.  Lymphadenopathy:    He has no cervical adenopathy.  Neurological: He is alert and oriented to person, place, and time.  Skin: Skin is warm and dry.  Psychiatric: He has a normal mood and affect. His behavior is normal.  Nursing note and vitals reviewed.     Assessment & Plan:   Problem List Items Addressed This Visit      Respiratory   Allergic rhinitis - Primary    Will start zyrtec and flonase daily given his environmental triggers and frequent URIs       Other Visit Diagnoses    Upper respiratory tract infection, unspecified type       Will treat with tessalon, tussionex, and mucinex. Good allergy regimen discussed. Start sinus rinses daily. F/u if worsening or no improvement       Follow up plan: Return if  symptoms worsen or fail to improve.

## 2016-09-26 NOTE — Telephone Encounter (Signed)
Insurance will not cover proventil HFA. They will cover Proair HFA, Proair respiclick, or Ventolin HFA.

## 2016-09-28 ENCOUNTER — Encounter: Payer: Self-pay | Admitting: Family Medicine

## 2016-09-28 ENCOUNTER — Other Ambulatory Visit: Payer: Self-pay | Admitting: Family Medicine

## 2016-09-28 MED ORDER — AZITHROMYCIN 250 MG PO TABS: ORAL_TABLET | ORAL | 0 refills | Status: DC

## 2016-09-29 ENCOUNTER — Other Ambulatory Visit: Payer: Self-pay | Admitting: Family Medicine

## 2016-09-29 MED ORDER — PREDNISONE 10 MG PO TABS: ORAL_TABLET | ORAL | 0 refills | Status: DC

## 2016-10-18 ENCOUNTER — Other Ambulatory Visit: Payer: Self-pay | Admitting: Family Medicine

## 2016-10-29 ENCOUNTER — Other Ambulatory Visit: Payer: Self-pay | Admitting: Family Medicine

## 2017-01-03 ENCOUNTER — Other Ambulatory Visit: Payer: Self-pay | Admitting: Family Medicine

## 2017-01-04 NOTE — Telephone Encounter (Signed)
Routing to provider. No follow up on file. 

## 2017-02-05 ENCOUNTER — Other Ambulatory Visit: Payer: Self-pay | Admitting: Family Medicine

## 2017-04-17 ENCOUNTER — Ambulatory Visit (INDEPENDENT_AMBULATORY_CARE_PROVIDER_SITE_OTHER): Payer: BLUE CROSS/BLUE SHIELD | Admitting: Family Medicine

## 2017-04-17 ENCOUNTER — Other Ambulatory Visit: Payer: Self-pay | Admitting: Family Medicine

## 2017-04-17 ENCOUNTER — Encounter: Payer: Self-pay | Admitting: Family Medicine

## 2017-04-17 VITALS — BP 127/86 | HR 78 | Temp 98.5°F | Wt 170.1 lb

## 2017-04-17 DIAGNOSIS — J029 Acute pharyngitis, unspecified: Secondary | ICD-10-CM | POA: Diagnosis not present

## 2017-04-17 MED ORDER — CETIRIZINE HCL 10 MG PO TABS: 10.0000 mg | ORAL_TABLET | Freq: Every day | ORAL | 11 refills | Status: AC

## 2017-04-17 MED ORDER — FLUTICASONE PROPIONATE 50 MCG/ACT NA SUSP: 2.0000 | Freq: Every day | NASAL | 6 refills | Status: AC

## 2017-04-17 MED ORDER — AZITHROMYCIN 250 MG PO TABS: ORAL_TABLET | ORAL | 0 refills | Status: DC

## 2017-04-17 NOTE — Progress Notes (Signed)
   BP 127/86 (BP Location: Right Arm, Patient Position: Sitting, Cuff Size: Normal)   Pulse 78   Temp 98.5 F (36.9 C)   Wt 170 lb 2 oz (77.2 kg)   SpO2 98%   BMI 23.20 kg/m    Subjective:    Patient ID: Evan Spencer, male    DOB: 1985/09/07, 32 y.o.   MRN: [id number]  HPI: Evan AversJoseph L Cinco Spencer is a 32 y.o. male  Chief Complaint  Patient presents with  . Sore Throat    Patient states that he has had a sore throat/ felt like a lump in his throat for about a month   Sore, swollen throat x 1 month. States it feels like he has a lump in his throat. Not trying anything other than lozenges for sxs. Denies any congestion, post-nasal drainage, cough, fevers, chills. No sick contacts.   Relevant past medical, surgical, family and social history reviewed and updated as indicated. Interim medical history since our last visit reviewed. Allergies and medications reviewed and updated.  Review of Systems  Constitutional: Negative.   HENT: Positive for sore throat.   Respiratory: Negative.   Cardiovascular: Negative.   Gastrointestinal: Negative.   Genitourinary: Negative.   Musculoskeletal: Negative.   Neurological: Negative.   Psychiatric/Behavioral: Negative.    Per HPI unless specifically indicated above     Objective:    BP 127/86 (BP Location: Right Arm, Patient Position: Sitting, Cuff Size: Normal)   Pulse 78   Temp 98.5 F (36.9 C)   Wt 170 lb 2 oz (77.2 kg)   SpO2 98%   BMI 23.20 kg/m   Wt Readings from Last 3 Encounters:  04/17/17 170 lb 2 oz (77.2 kg)  09/26/16 153 lb (69.4 kg)  05/16/16 152 lb (68.9 kg)    Physical Exam  Constitutional: He is oriented to person, place, and time. He appears well-developed and well-nourished. No distress.  HENT:  Head: Atraumatic.  Right Ear: External ear normal.  Left Ear: External ear normal.  Nose: Nose normal.  Mouth/Throat: No oropharyngeal exudate.  Oropharynx erythematous and mildly edematous  Eyes: Conjunctivae  are normal. Pupils are equal, round, and reactive to light.  Neck: Normal range of motion. Neck supple.  Cardiovascular: Normal rate and normal heart sounds.  Pulmonary/Chest: Effort normal and breath sounds normal. No respiratory distress.  Musculoskeletal: Normal range of motion.  Neurological: He is alert and oriented to person, place, and time.  Skin: Skin is warm and dry.  Psychiatric: He has a normal mood and affect. His behavior is normal.  Nursing note and vitals reviewed.     Assessment & Plan:   Problem List Items Addressed This Visit    None    Visit Diagnoses    Sore throat    -  Primary   Suspect allergic vs bacteria. Does not appear to be strep, but given duration will tx with zpack as well as restart allergy regimen with zyrtec and flonase       Follow up plan: Return if symptoms worsen or fail to improve.

## 2017-04-17 NOTE — Patient Instructions (Signed)
Follow up as needed

## 2017-05-10 ENCOUNTER — Encounter: Payer: Self-pay | Admitting: Family Medicine

## 2017-05-10 ENCOUNTER — Ambulatory Visit (INDEPENDENT_AMBULATORY_CARE_PROVIDER_SITE_OTHER): Payer: BLUE CROSS/BLUE SHIELD | Admitting: Family Medicine

## 2017-05-10 VITALS — BP 116/81 | HR 74 | Temp 97.7°F | Wt 167.1 lb

## 2017-05-10 DIAGNOSIS — B001 Herpesviral vesicular dermatitis: Secondary | ICD-10-CM

## 2017-05-10 MED ORDER — VALACYCLOVIR HCL 1 G PO TABS: 1000.0000 mg | ORAL_TABLET | Freq: Two times a day (BID) | ORAL | 3 refills | Status: AC

## 2017-05-10 NOTE — Patient Instructions (Signed)
Follow up as needed

## 2017-05-10 NOTE — Progress Notes (Signed)
BP 116/81 (BP Location: Left Arm, Patient Position: Sitting, Cuff Size: Normal)   Pulse 74   Temp 97.7 F (36.5 C) (Oral)   Wt 167 lb 1.6 oz (75.8 kg)   SpO2 100%   BMI 22.79 kg/m    Subjective:    Patient ID: Evan AversJoseph L Ackerman Spencer, male    DOB: Mar 20, 1986, 32 y.o.   MRN: [id number]  HPI: Evan Spencer is a 32 y.o. male  Chief Complaint  Patient presents with  . Mouth Lesions    Started yesterday, on left side of lip.   Cold sore on lip that started yesterday. Seems to only happen when he goes out in the sun. Was hunting over weekend and thinks that triggered it. Has also had lots of increased work stress lately. States these happen about twice yearly for him, this is his first time seeking care for it. Currently just using carmex for it. Burning, crusting.   Relevant past medical, surgical, family and social history reviewed and updated as indicated. Interim medical history since our last visit reviewed. Allergies and medications reviewed and updated.  Review of Systems  Constitutional: Negative.   Respiratory: Negative.   Cardiovascular: Negative.   Genitourinary: Negative.  Negative for genital sores.  Musculoskeletal: Negative.   Skin:       Cold sore, lip  Neurological: Negative.   Psychiatric/Behavioral: Negative.    Per HPI unless specifically indicated above     Objective:    BP 116/81 (BP Location: Left Arm, Patient Position: Sitting, Cuff Size: Normal)   Pulse 74   Temp 97.7 F (36.5 C) (Oral)   Wt 167 lb 1.6 oz (75.8 kg)   SpO2 100%   BMI 22.79 kg/m   Wt Readings from Last 3 Encounters:  05/10/17 167 lb 1.6 oz (75.8 kg)  04/17/17 170 lb 2 oz (77.2 kg)  09/26/16 153 lb (69.4 kg)    Physical Exam  Constitutional: He is oriented to person, place, and time. He appears well-developed and well-nourished. No distress.  HENT:  Head: Atraumatic.  Eyes: Conjunctivae are normal.  Neck: Normal range of motion. Neck supple.  Cardiovascular: Normal  rate and normal heart sounds.  Pulmonary/Chest: Effort normal and breath sounds normal. No respiratory distress.  Musculoskeletal: Normal range of motion.  Neurological: He is alert and oriented to person, place, and time.  Skin: Skin is warm and dry.  Crusted cold sore, bottom lip on the left  Psychiatric: He has a normal mood and affect. His behavior is normal.  Nursing note and vitals reviewed.  Results for orders placed or performed in visit on 09/11/15  CBC with Differential/Platelet  Result Value Ref Range   WBC 7.7 3.4 - 10.8 x10E3/uL   RBC 5.36 4.14 - 5.80 x10E6/uL   Hemoglobin 16.3 12.6 - 17.7 g/dL   Hematocrit 40.946.0 81.137.5 - 51.0 %   MCV 86 79 - 97 fL   MCH 30.4 26.6 - 33.0 pg   MCHC 35.4 31.5 - 35.7 g/dL   RDW 91.412.3 78.212.3 - 95.615.4 %   Platelets 198 150 - 379 x10E3/uL   Neutrophils 61 %   Lymphs 29 %   Monocytes 7 %   Eos 2 %   Basos 1 %   Neutrophils Absolute 4.6 1.4 - 7.0 x10E3/uL   Lymphocytes Absolute 2.3 0.7 - 3.1 x10E3/uL   Monocytes Absolute 0.6 0.1 - 0.9 x10E3/uL   EOS (ABSOLUTE) 0.2 0.0 - 0.4 x10E3/uL   Basophils Absolute 0.1 0.0 -  0.2 x10E3/uL   Immature Granulocytes 0 %   Immature Grans (Abs) 0.0 0.0 - 0.1 x10E3/uL  Comprehensive metabolic panel  Result Value Ref Range   Glucose 95 65 - 99 mg/dL   BUN 18 6 - 20 mg/dL   Creatinine, Ser 1.61 0.76 - 1.27 mg/dL   GFR calc non Af Amer 86 >59 mL/min/1.73   GFR calc Af Amer 100 >59 mL/min/1.73   BUN/Creatinine Ratio 16 9 - 20   Sodium 137 134 - 144 mmol/L   Potassium 4.6 3.5 - 5.2 mmol/L   Chloride 98 96 - 106 mmol/L   CO2 23 18 - 29 mmol/L   Calcium 9.9 8.7 - 10.2 mg/dL   Total Protein 7.7 6.0 - 8.5 g/dL   Albumin 5.1 3.5 - 5.5 g/dL   Globulin, Total 2.6 1.5 - 4.5 g/dL   Albumin/Globulin Ratio 2.0 1.2 - 2.2   Bilirubin Total 1.2 0.0 - 1.2 mg/dL   Alkaline Phosphatase 67 39 - 117 IU/L   AST 18 0 - 40 IU/L   ALT 18 0 - 44 IU/L  Lipid Panel w/o Chol/HDL Ratio  Result Value Ref Range   Cholesterol, Total  183 100 - 199 mg/dL   Triglycerides 92 0 - 149 mg/dL   HDL 52 >09 mg/dL   VLDL Cholesterol Cal 18 5 - 40 mg/dL   LDL Calculated 604 (H) 0 - 99 mg/dL  TSH  Result Value Ref Range   TSH 1.800 0.450 - 4.500 uIU/mL  Urinalysis, Routine w reflex microscopic (not at [facility])  Result Value Ref Range   Specific Gravity, UA 1.010 1.005 - 1.030   pH, UA 6.5 5.0 - 7.5   Color, UA Yellow Yellow   Appearance Ur Clear Clear   Leukocytes, UA Negative Negative   Protein, UA Negative Negative/Trace   Glucose, UA Negative Negative   Ketones, UA Negative Negative   RBC, UA Negative Negative   Bilirubin, UA Negative Negative   Urobilinogen, Ur 0.2 0.2 - 1.0 mg/dL   Nitrite, UA Negative Negative  HIV antibody  Result Value Ref Range   HIV Screen 4th Generation wRfx Non Reactive Non Reactive      Assessment & Plan:   Problem List Items Addressed This Visit    None    Visit Diagnoses    Recurrent cold sores    -  Primary   Will perform confirmatory labs and start spot treatment valtrex. Contiue with vaseline or carmex for topical comfort. Transmission precautions reviewed   Relevant Medications   valACYclovir (VALTREX) 1000 MG tablet   Other Relevant Orders   HSV(herpes simplex vrs) 1+2 ab-IgG       Follow up plan: Return if symptoms worsen or fail to improve.

## 2017-05-11 LAB — HSV(HERPES SIMPLEX VRS) I + II AB-IGG: HSV 1 Glycoprotein G Ab, IgG: 34.1 index — ABNORMAL HIGH (ref 0.00–0.90)

## 2017-12-21 ENCOUNTER — Other Ambulatory Visit: Payer: Self-pay | Admitting: Family Medicine

## 2018-01-15 ENCOUNTER — Other Ambulatory Visit: Payer: Self-pay | Admitting: Family Medicine

## 2018-01-16 NOTE — Telephone Encounter (Signed)
Requested Prescriptions  Pending Prescriptions Disp Refills  . omeprazole (PRILOSEC) 20 MG capsule [Pharmacy Med Name: OMEPRAZOLE 20MG  CAPSULES] 30 capsule 0    Sig: TAKE 1 CAPSULE BY MOUTH DAILY     Gastroenterology: Proton Pump Inhibitors Passed - 01/15/2018 10:20 AM      Passed - Valid encounter within last 12 months    Recent Outpatient Visits          8 months ago Recurrent cold sores   [facility] Roosvelt Maser [place], [place]   9 months ago Sore throat   [facility] Roosvelt Maser [place], [place]   1 year ago Allergic rhinitis, unspecified seasonality, unspecified trigger   [facility] Particia Nearing, [place]   1 year ago Upper respiratory tract infection, unspecified type   [facility] Particia Nearing, [place]   2 years ago Routine general medical examination at a health care facility   [facility], Redge Gainer, [place]

## 2018-02-12 ENCOUNTER — Other Ambulatory Visit: Payer: Self-pay | Admitting: Family Medicine

## 2018-02-13 NOTE — Telephone Encounter (Signed)
Requested Prescriptions  Pending Prescriptions Disp Refills  . omeprazole (PRILOSEC) 20 MG capsule [Pharmacy Med Name: OMEPRAZOLE 20MG  CAPSULES] 90 capsule 0    Sig: TAKE 1 CAPSULE BY MOUTH DAILY     Gastroenterology: Proton Pump Inhibitors Passed - 02/12/2018  1:33 PM      Passed - Valid encounter within last 12 months    Recent Outpatient Visits          9 months ago Recurrent cold sores   [facility] Roosvelt Maser [place], [place]   10 months ago Sore throat   [facility] Roosvelt Maser [place], [place]   1 year ago Allergic rhinitis, unspecified seasonality, unspecified trigger   [facility] Particia Nearing, [place]   1 year ago Upper respiratory tract infection, unspecified type   [facility] Particia Nearing, [place]   2 years ago Routine general medical examination at a health care facility   [facility], Redge Gainer, [place]

## 2018-04-14 ENCOUNTER — Other Ambulatory Visit: Payer: Self-pay | Admitting: Family Medicine
